# Patient Record
Sex: Male | Born: 1990 | Race: Black or African American | Hispanic: No | Marital: Single | Smoking: Current every day smoker
Health system: Southern US, Community
[De-identification: ages and names within clinical notes are randomized; demographics above are authoritative.]

## PROBLEM LIST (undated history)

## (undated) DIAGNOSIS — E079 Disorder of thyroid, unspecified: Secondary | ICD-10-CM

---
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
## 2011-04-13 ENCOUNTER — Other Ambulatory Visit: Payer: Self-pay | Admitting: Family Medicine

## 2011-04-13 ENCOUNTER — Other Ambulatory Visit: Payer: Self-pay

## 2011-04-13 ENCOUNTER — Other Ambulatory Visit: Payer: Self-pay | Admitting: *Deleted

## 2011-04-13 DIAGNOSIS — E079 Disorder of thyroid, unspecified: Secondary | ICD-10-CM

## 2011-04-13 DIAGNOSIS — E041 Nontoxic single thyroid nodule: Secondary | ICD-10-CM

## 2011-04-18 ENCOUNTER — Ambulatory Visit
Admission: RE | Admit: 2011-04-18 | Discharge: 2011-04-18 | Disposition: A | Discharge status: Home / Self Care | Payer: PRIVATE HEALTH INSURANCE | Source: Ambulatory Visit | Attending: Family Medicine | Admitting: Family Medicine

## 2011-04-18 ENCOUNTER — Other Ambulatory Visit (HOSPITAL_COMMUNITY)
Admission: RE | Admit: 2011-04-18 | Discharge: 2011-04-18 | Disposition: A | Discharge status: Home / Self Care | Payer: Self-pay | Source: Ambulatory Visit | Attending: Interventional Radiology | Admitting: Interventional Radiology

## 2011-04-18 ENCOUNTER — Other Ambulatory Visit: Payer: Self-pay | Admitting: Interventional Radiology

## 2011-04-18 DIAGNOSIS — E049 Nontoxic goiter, unspecified: Secondary | ICD-10-CM | POA: Insufficient documentation

## 2011-04-18 DIAGNOSIS — E079 Disorder of thyroid, unspecified: Secondary | ICD-10-CM

## 2012-12-29 IMAGING — US US THYROID BIOPSY
1 series · 6 of 6 positions shown · non-contrast
Comparison: none

CLINICAL DATA: Complex left thyroid lesion.

ULTRASOUND-GUIDED THYROID ASPIRATION BIOPSY
TECHNIQUE: Survey ultrasound was performed and the dominant lesion
in the left lobe was localized.  An appropriate skin entry site was
determined.  Skin was marked, then prepped with Betadine, draped in
usual sterile fashion, and infiltrated locally with 1% lidocaine.
Under real-time ultrasound guidance, 4  passes were made into the
lesion with 25 and 18 gauge needles sampling cystic and solid
components.  The patient tolerated procedure well, with no
immediate complications.
IMPRESSION
1.  Technically successful ultrasound-guided thyroid aspiration
biopsy

[Series 1: us thyroid biopsy · 0.08mm/px · 6 acquisitions, 6 frames shown]
[im 1/6]
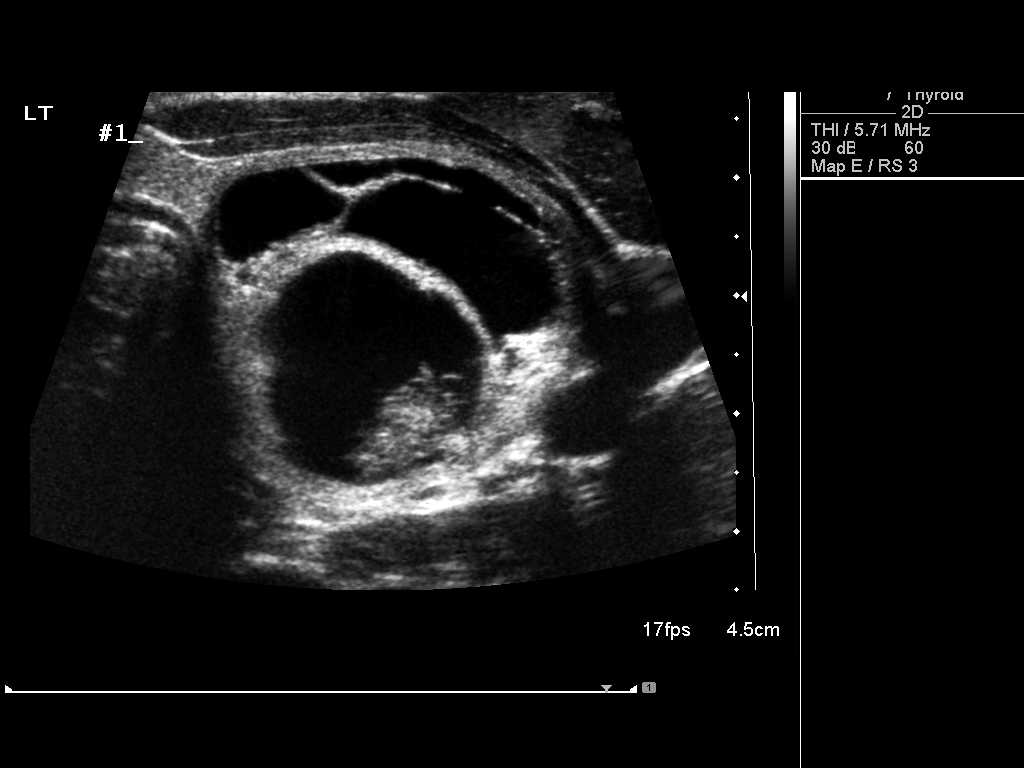
[im 2/6]
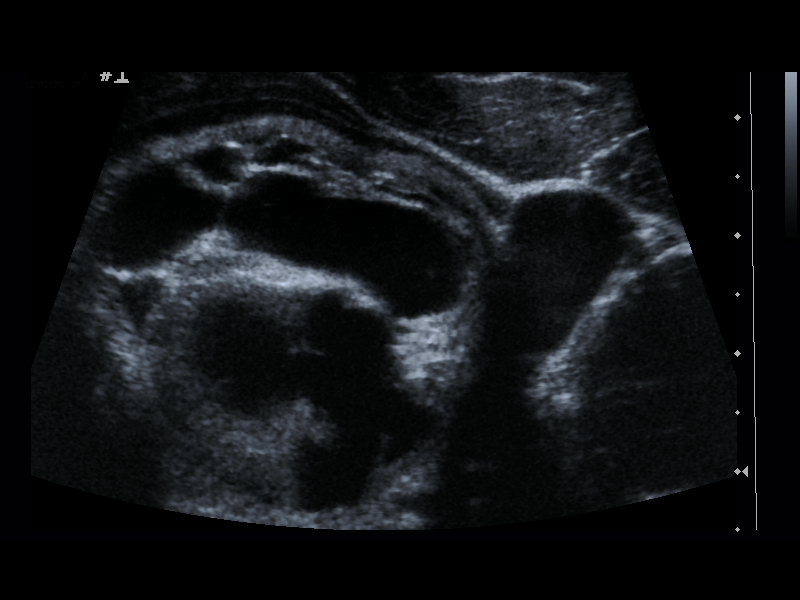
[im 3/6]
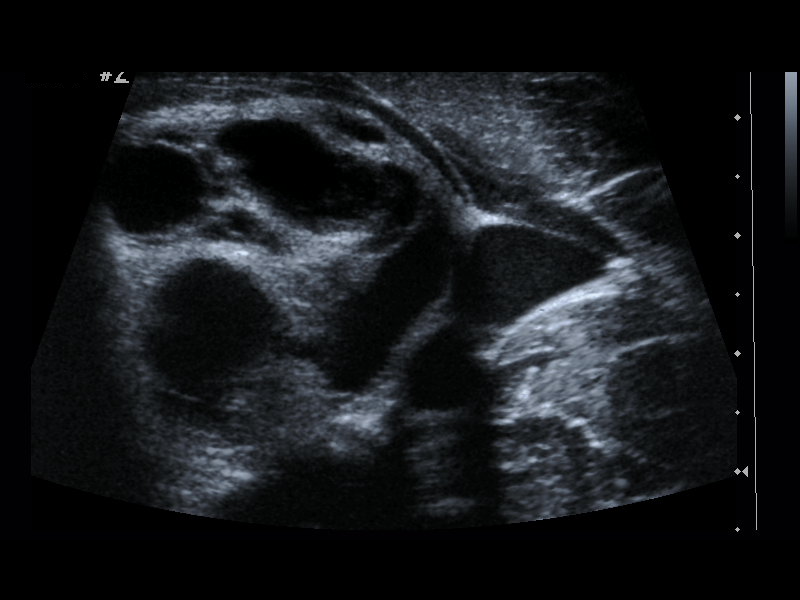
[im 4/6]
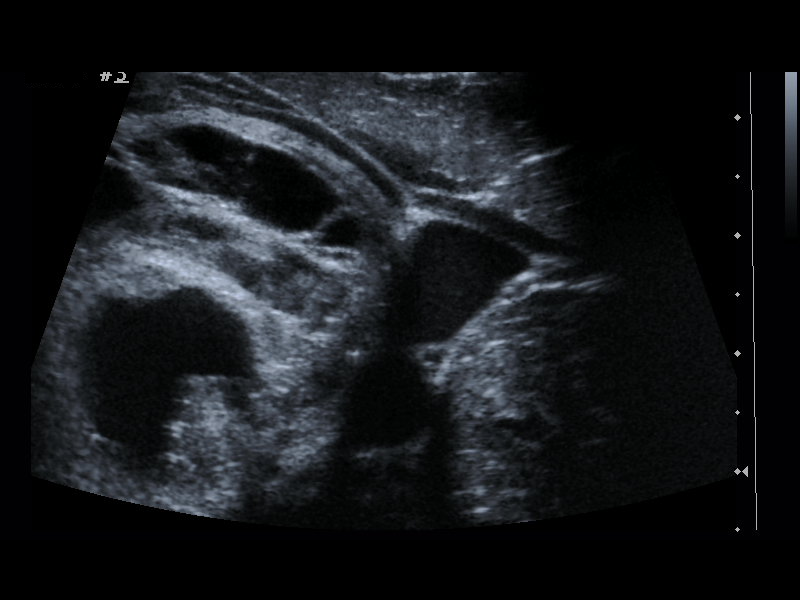
[im 5/6]
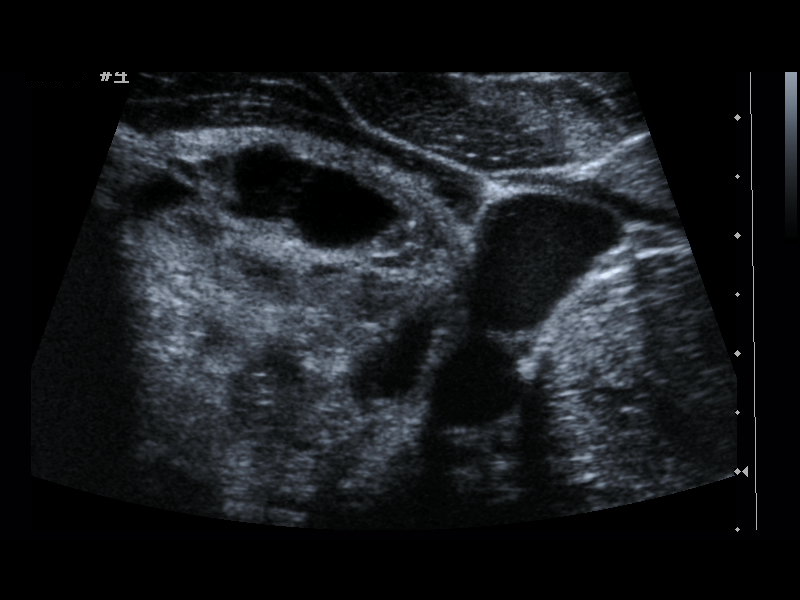
[im 6/6]
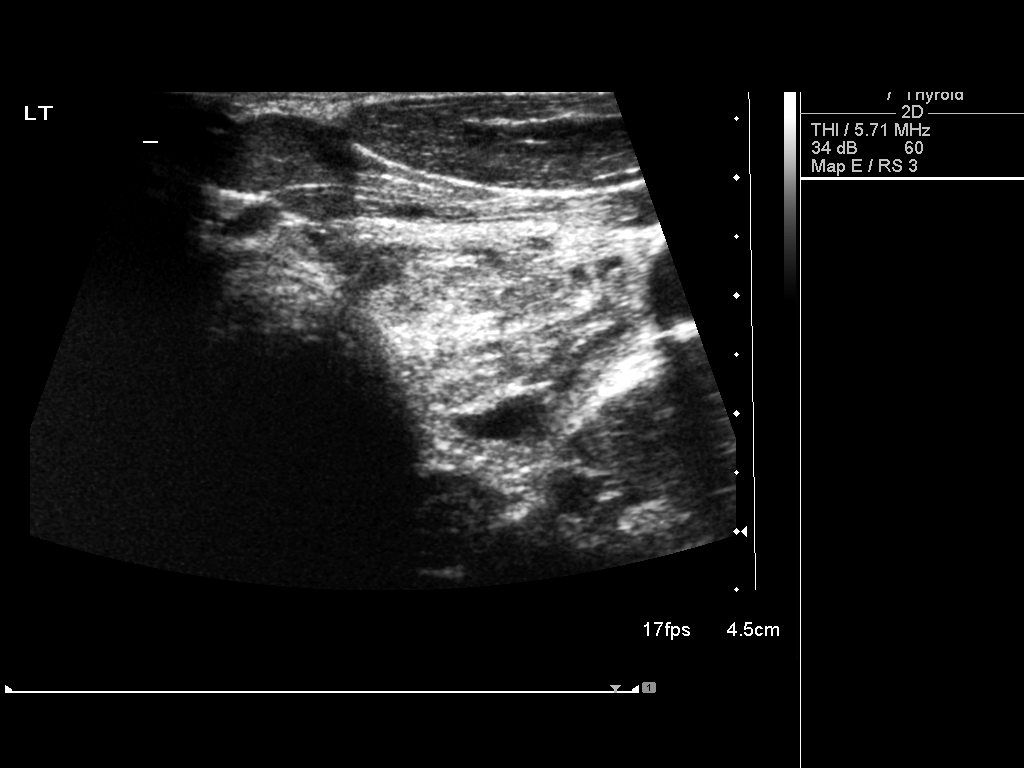

[6 of 6 positions shown; findings below may reference images not displayed]

## 2014-07-16 ENCOUNTER — Emergency Department (HOSPITAL_COMMUNITY)
Admission: EM | Admit: 2014-07-16 | Discharge: 2014-07-16 | Disposition: A | Discharge status: Home / Self Care | Payer: PRIVATE HEALTH INSURANCE

## 2014-07-16 NOTE — ED Notes (Signed)
Pt not found in lobby or outside of ER, first call for triage.

## 2014-07-16 NOTE — ED Notes (Signed)
2nd call for triage, no response from patient.

## 2016-02-13 ENCOUNTER — Encounter (HOSPITAL_COMMUNITY): Payer: Self-pay | Admitting: Nurse Practitioner

## 2016-02-13 ENCOUNTER — Ambulatory Visit (HOSPITAL_COMMUNITY)
Admission: EM | Admit: 2016-02-13 | Discharge: 2016-02-13 | Disposition: A | Discharge status: Home / Self Care | Payer: BLUE CROSS/BLUE SHIELD | Attending: Family Medicine | Admitting: Family Medicine

## 2016-02-13 DIAGNOSIS — E079 Disorder of thyroid, unspecified: Secondary | ICD-10-CM | POA: Diagnosis not present

## 2016-02-13 DIAGNOSIS — F17200 Nicotine dependence, unspecified, uncomplicated: Secondary | ICD-10-CM | POA: Diagnosis not present

## 2016-02-13 DIAGNOSIS — R319 Hematuria, unspecified: Secondary | ICD-10-CM | POA: Insufficient documentation

## 2016-02-13 HISTORY — DX: Disorder of thyroid, unspecified: E07.9

## 2016-02-13 LAB — POCT URINALYSIS DIP (DEVICE)
Glucose, UA: NEGATIVE mg/dL
Ketones, ur: NEGATIVE mg/dL
Nitrite: NEGATIVE
Protein, ur: 30 mg/dL — AB
Specific Gravity, Urine: >=1.03 (ref 1.005–1.030)
Urobilinogen, UA: 0.2 mg/dL (ref 0.0–1.0)
pH: 6 (ref 5.0–8.0)

## 2016-02-13 MED ORDER — CEPHALEXIN 500 MG PO CAPS: 500.0000 mg | ORAL_CAPSULE | Freq: Three times a day (TID) | ORAL | Status: DC

## 2016-02-13 NOTE — Discharge Instructions (Signed)

## 2016-02-13 NOTE — ED Provider Notes (Signed)
CSN: [id number]     Arrival date & time 02/13/16  1430 History   First MD Initiated Contact with Patient 02/13/16 1531     Chief Complaint  Patient presents with  . Hematuria   (Consider location/radiation/quality/duration/timing/severity/associated sxs/prior Treatment) HPI Pt states that at the end of urination this morning he noted a couple of drops of blood. He denies any pain. Last intercourse about 2 weeks ago. Has had a bit of burning, but not today. Also some mild abdo pain in the last couple of days. Past Medical History  Diagnosis Date  . Thyroid disease    History reviewed. No pertinent past surgical history. History reviewed. No pertinent family history. Social History  Substance Use Topics  . Smoking status: Current Every Day Smoker    Types: Cigarettes  . Smokeless tobacco: None  . Alcohol Use: Yes    Review of Systems bood in urine Allergies  Review of patient's allergies indicates no known allergies.  Home Medications   Prior to Admission medications   Medication Sig Start Date End Date Taking? Authorizing Provider  cephALEXin (KEFLEX) 500 MG capsule Take 1 capsule (500 mg total) by mouth 3 (three) times daily. 02/13/16   Tharon AquasFrank C Maigen Mozingo, PA   Meds Ordered and Administered this Visit  Medications - No data to display  BP 132/73 mmHg  Pulse 87  Temp(Src) 99.2 F (37.3 C) (Oral)  Resp 18  SpO2 97% No data found.   Physical Exam NURSES NOTES AND VITAL SIGNS REVIEWED. CONSTITUTIONAL: Well developed, well nourished, no acute distress HEENT: normocephalic, atraumatic EYES: Conjunctiva normal NECK:normal ROM, supple, no adenopathy PULMONARY:No respiratory distress, normal effort MUSCULOSKELETAL: Normal ROM of all extremities,  SKIN: warm and dry without rash PSYCHIATRIC: Mood and affect, behavior are normal  ED Course  Procedures (including critical care time)  Labs Review Labs Reviewed  POCT URINALYSIS DIP (DEVICE) - Abnormal; Notable for the  following:    Bilirubin Urine SMALL (*)    Hgb urine dipstick MODERATE (*)    Protein, ur 30 (*)    Leukocytes, UA SMALL (*)    All other components within normal limits  URINE CULTURE  URINE CYTOLOGY ANCILLARY ONLY    Imaging Review No results found.   Visual Acuity Review  Right Eye Distance:   Left Eye Distance:   Bilateral Distance:    Right Eye Near:   Left Eye Near:    Bilateral Near:      Await urine and std cultures to return rx for keflex is provided.   MDM   1. Hematuria of undiagnosed cause     Patient is reassured that there are no issues that require transfer to higher level of care at this time or additional tests. Patient is advised to continue home symptomatic treatment. Patient is advised that if there are new or worsening symptoms to attend the emergency department, contact primary care provider, or return to UC. Instructions of care provided discharged home in stable condition.    THIS NOTE WAS GENERATED USING A VOICE RECOGNITION SOFTWARE PROGRAM. ALL REASONABLE EFFORTS  WERE MADE TO PROOFREAD THIS DOCUMENT FOR ACCURACY.  I have verbally reviewed the discharge instructions with the patient. A printed AVS was given to the patient.  All questions were answered prior to discharge.      Tharon AquasFrank C Yazir Koerber, PA 02/13/16 [phone]

## 2016-02-13 NOTE — ED Notes (Signed)
Pt c/o one episode of hematuria when waking this morning. He reports pain during the episode but no pain now. He did have an episode of mild abd pain last night that resolved. He denies fevers, n/v. He is sexually active sometimes uses protection.

## 2016-02-14 LAB — URINE CYTOLOGY ANCILLARY ONLY
CHLAMYDIA, DNA PROBE: NEGATIVE
Neisseria Gonorrhea: NEGATIVE

## 2016-02-15 LAB — URINE CULTURE: SPECIAL REQUESTS: NORMAL

## 2016-02-15 NOTE — ED Notes (Signed)
Pt  Asked   Where  His  rx  Was  Sent  To  Chart  Reviewed   Pt  wa  Given  Hard  Copy    Pt  Was  Advised  To take  It to a  Pharmacy   To  Get it filled

## 2016-02-19 ENCOUNTER — Telehealth (HOSPITAL_COMMUNITY): Payer: Self-pay | Admitting: Emergency Medicine

## 2016-02-19 NOTE — ED Notes (Signed)
Pt called back... Results given after being IDd properly. Adv pt to return if sx return.... Pt verb understanding.

## 2016-02-19 NOTE — ED Notes (Signed)
LM on pt's VM [phone] Need to give lab results from recent visit on 4/16 Also let pt know labs can be obtained from MyChart  Per Dr. Dayton ScrapeMurray,  Please let patient know that urine culture does not demonstrate UTI; tests for gonorrhea/chlamydia were negative. [facility] ok to finish this.  Recheck for further evaluation if hematuria/urinary discomfort persist. LM

## 2020-01-11 ENCOUNTER — Other Ambulatory Visit: Payer: Self-pay

## 2020-01-11 ENCOUNTER — Emergency Department (HOSPITAL_COMMUNITY)
Admission: EM | Admit: 2020-01-11 | Discharge: 2020-01-11 | Disposition: A | Discharge status: Home / Self Care | Payer: Self-pay | Attending: Emergency Medicine | Admitting: Emergency Medicine

## 2020-01-11 ENCOUNTER — Emergency Department (HOSPITAL_COMMUNITY): Payer: Self-pay

## 2020-01-11 DIAGNOSIS — S62339A Displaced fracture of neck of unspecified metacarpal bone, initial encounter for closed fracture: Secondary | ICD-10-CM

## 2020-01-11 DIAGNOSIS — Y92009 Unspecified place in unspecified non-institutional (private) residence as the place of occurrence of the external cause: Secondary | ICD-10-CM | POA: Insufficient documentation

## 2020-01-11 DIAGNOSIS — W228XXA Striking against or struck by other objects, initial encounter: Secondary | ICD-10-CM | POA: Insufficient documentation

## 2020-01-11 DIAGNOSIS — Y939 Activity, unspecified: Secondary | ICD-10-CM | POA: Insufficient documentation

## 2020-01-11 DIAGNOSIS — S62336A Displaced fracture of neck of fifth metacarpal bone, right hand, initial encounter for closed fracture: Secondary | ICD-10-CM | POA: Insufficient documentation

## 2020-01-11 DIAGNOSIS — Y999 Unspecified external cause status: Secondary | ICD-10-CM | POA: Insufficient documentation

## 2020-01-11 DIAGNOSIS — F17210 Nicotine dependence, cigarettes, uncomplicated: Secondary | ICD-10-CM | POA: Insufficient documentation

## 2020-01-11 DIAGNOSIS — Z23 Encounter for immunization: Secondary | ICD-10-CM | POA: Insufficient documentation

## 2020-01-11 MED ORDER — OXYCODONE-ACETAMINOPHEN 5-325 MG PO TABS: 2.0000 | ORAL_TABLET | ORAL | 0 refills | Status: DC | PRN

## 2020-01-11 MED ORDER — OXYCODONE-ACETAMINOPHEN 5-325 MG PO TABS
1.0000 | ORAL_TABLET | Freq: Once | ORAL | Status: AC
  Administered 2020-01-11: 17:00:00 1 via ORAL
  Filled 2020-01-11: qty 1

## 2020-01-11 MED ORDER — TETANUS-DIPHTH-ACELL PERTUSSIS 5-2.5-18.5 LF-MCG/0.5 IM SUSP
0.5000 mL | Freq: Once | INTRAMUSCULAR | Status: AC
Start: 2020-01-11 — End: 2020-01-11
  Administered 2020-01-11: 0.5 mL via INTRAMUSCULAR
  Filled 2020-01-11: qty 0.5

## 2020-01-11 NOTE — Discharge Instructions (Addendum)
Please follow up with hand surgery for re-evaluation. Call tomorrow morning to schedule an appointment.   Please use Tylenol or ibuprofen for pain.  You may use 600 mg ibuprofen every 6 hours or 1000 mg of Tylenol every 6 hours.  You may choose to alternate between the 2.  This would be most effective.  Not to exceed 4 g of Tylenol within 24 hours.  Not to exceed 3200 mg ibuprofen 24 hours.  Take percocet as needed for pain if ibu/ty aren't able to stop pain.   Wear splint until follow up with hand surgery.   Rest, ice and elevate your hand

## 2020-01-11 NOTE — ED Triage Notes (Signed)
Pt to ED with c/o of right hand pain/swelling from punching the wall. Pt hand is swollen and hot to the touch. Pt states pain is 3/10 and took ibuprofen this morning with relief.

## 2020-01-11 NOTE — ED Notes (Signed)
Called [facility] ortho tech and she will be here in 10 minutes.
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
## 2020-01-12 ENCOUNTER — Encounter: Payer: Self-pay | Admitting: Plastic Surgery

## 2020-01-12 ENCOUNTER — Ambulatory Visit (INDEPENDENT_AMBULATORY_CARE_PROVIDER_SITE_OTHER): Payer: Self-pay | Admitting: Plastic Surgery

## 2020-01-12 VITALS — BP 146/92 | HR 86 | Temp 98.8°F | Ht 72.0 in | Wt 251.0 lb

## 2020-01-12 DIAGNOSIS — S62336A Displaced fracture of neck of fifth metacarpal bone, right hand, initial encounter for closed fracture: Secondary | ICD-10-CM
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
## 2020-01-21 ENCOUNTER — Other Ambulatory Visit: Payer: Self-pay

## 2020-01-21 ENCOUNTER — Encounter: Payer: Self-pay | Admitting: *Deleted

## 2020-01-21 ENCOUNTER — Ambulatory Visit: Payer: Self-pay | Attending: Plastic Surgery | Admitting: *Deleted

## 2020-01-21 DIAGNOSIS — M6281 Muscle weakness (generalized): Secondary | ICD-10-CM | POA: Insufficient documentation

## 2020-01-21 DIAGNOSIS — M25641 Stiffness of right hand, not elsewhere classified: Secondary | ICD-10-CM | POA: Insufficient documentation

## 2020-01-21 DIAGNOSIS — R601 Generalized edema: Secondary | ICD-10-CM | POA: Insufficient documentation

## 2020-01-21 DIAGNOSIS — R278 Other lack of coordination: Secondary | ICD-10-CM | POA: Insufficient documentation

## 2020-01-21 NOTE — Therapy (Signed)
[facility] [address] [place], [place], [address] Phone: [phone]   Fax:  [phone]  Occupational Therapy Evaluation  Patient Details  Name: Eric King M Bonham MRN: [id number] Date of Birth: 02/26/1991 Referring Provider (OT): Wendy Poetollier S. Arita MissPace, MD   Encounter Date: 01/21/2020  OT End of Session - 01/21/20 [id number]    Visit Number  1    Number of Visits  12    Date for OT Re-Evaluation  02/18/20    Authorization Type  Pt is currently self pay. He may apply for Medicaid per his report.    OT Start Time  0800    OT Stop Time  0915    OT Time Calculation (min)  75 min    Activity Tolerance  Patient tolerated treatment well    Behavior During Therapy  WFL for tasks assessed/performed       Past Medical History:  Diagnosis Date   Thyroid disease     History reviewed. No pertinent surgical history.  There were no vitals filed for this visit.  Subjective Assessment - 01/21/20 0806    Subjective   Pt is a R HD male s/p R Boxer's Fracture on 01/11/2020 after he punched a wall. He was splinted in the ER on 01/11/20, followed up with Dr Arita MissPace on 01/12/20 at which time it was determined that he would not undergo surgery. He presents to out-pt OT today for buddy taping and transitioning from splint. Pt presents to clinic w/o splint R hand.    Pertinent History  R hand Boxers Fracture 01/11/2020, non-surgical intervention. R HD. Thyroid enlarged - does not take medication, MD is monitoring. Otherwise non-significant PMH , please see chart for full medical history.    Limitations  Pt verbaliuzed that he was supposed to be wearing the splint as fabricated in the ER "For the next 2 weeks" until he f/u with Dr Arita MissPace. He presented to the clinic today w/o any protective splint on and stated that he had removed it himself at home. He was educated to not use R hand functionally until cleared by the Dr to do so. He was then fitted with a  protective hand based splint placing his MCP's in flexion and his IP's in extension to his right RF/small as well as issued buddy straps per MD orders.    Patient Stated Goals  Pt reports "Get the stiffness out and move it w/o a problem" Get back to normal.    Currently in Pain?  No/denies    Pain Score  0-No pain    Multiple Pain Sites  No        [facility] OT Assessment - 01/21/20 0001      Assessment   Medical Diagnosis  R Hand 5th Metacarpal Fracture     Referring Provider (OT)  Wendy Poetollier S. Arita MissPace, MD    Onset Date/Surgical Date  01/11/20   Date of Injury   Hand Dominance  Right    Next MD Visit  02/13/2020      Precautions   Precautions  None    Required Braces or Orthoses  Other Brace/Splint   Pt presents w/o splint as issued by ER. Needs buddy taping.     Restrictions   Weight Bearing Restrictions  No   Pt reports no WB restrictions, however he was advised agains     Balance Screen   Has the patient fallen in the past 6 months  No    Has the patient had  a decrease in activity level because of a fear of falling?   No    Is the patient reluctant to leave their home because of a fear of falling?   No      Home  Environment   Family/patient expects to be discharged to:  Private residence    Lives With  --   Family and friends per pt report     Prior Function   Level of Independence  Independent    Vocation  --   Works at Engineer, site ducts and vents, out until next week   Leisure  --   Printmaker with friends, go to [employer], video games.     ADL   ADL comments  Pt reports some difficulty with basic ADL's requiring R dominant hand for opening, bathing, dressing but is otherwise Mod I given incresaed time.      Written Expression   Dominant Hand  Right      Activity Tolerance   Activity Tolerance  Endurance does not limit participation in activity      Cognition   Overall Cognitive Status  Within Functional Limits for tasks assessed      Observation/Other Assessments     Observations  Edema noted upon visual observation in clinic today esp along Right dorsal MCP area/5th metacarpal and digits.    Skin Integrity  Dorsal healing wounds right hand between middle and ring fingers and distal to MCP of small finger noted. No drainage noted.      Sensation   Light Touch  Appears Intact      Coordination   Gross Motor Movements are Fluid and Coordinated  Yes    Fine Motor Movements are Fluid and Coordinated  No    Coordination and Movement Description  Stiffness noted right ring and small fingers      Edema   Edema  See notes above. Min-mod edema noted upon visual observation comparing Right to left non-injured hand.      ROM / Strength   AROM / PROM / Strength  AROM      AROM   Overall AROM   Deficits      Right Hand AROM   R Ring  MCP 0-90  --   0-60   R Ring PIP 0-100  --   0-96*   R Ring DIP 0-70  --   0-60*   R Little  MCP 0-90  --   -20-45*   R Little PIP 0-100  --   0-100*   R Little DIP 0-70  --   0-75*     Hand Function   Right Hand Gross Grasp  --   NT secondary to fracture MCP 5th   Right Hand Grip (lbs)  --   NT secondary to 5th Metacarpal Fx.      OT Treatments/Exercises (OP) - 01/21/20 0001      Splinting   Splinting  Pt was issued buddy straps as prescribed by MD in orders for right RF/Small following boxer's fracture to 5th metacarpal. Pt was instructed in use, care and precautions. Following this education, pt stated that he was supposed to wear his protective splint as formed in the ER on 01/11/2020, but he had removed it himself and presented to the clinic today without anything on. He was then fitted with a protective hand based splint that places his right ring and small fingers in MCP flexion and IP extension. Pt was educated in splint use, care and precautions.  He verbalized understanding of all of the above. He was advised to not use this right dominant hand for functional activity until given the "ok" by Dr Arita Miss based  on fracture healing. He again verbalized undertanding of this in the clinic today. He was issued writtend instructions for all of the above and this was also reviewed in the clinic.         How to Buddy Tape Buddy taping refers to taping an injured finger or toe to an uninjured finger or toe that is next to it. This protects the injured finger or toe and keeps it from moving while the injury heals. You may buddy tape a finger or toe if you have a minor sprain. Your health care provider may buddy tape your finger or toe if you have a sprain, dislocation, or fracture. You may be told to replace your buddy taping as needed. What are the risks? Generally, buddy taping is safe. However, problems may occur, such as:  Skin injury or infection.  Reduced blood flow to the finger or toe.  Skin reaction to the tape. Do not buddy tape your toe if you have diabetes. Do not buddy tape if you know that you have an allergy to adhesives or surgical tape. Supplies needed:  Gauze pad, cotton, or cloth.  Tape. This may be called first-aid tape, surgical tape, or medical tape. How to buddy tape Before buddy taping Lessen any pain and swelling with rest, icing, and elevation:  Avoid activities that cause pain.  If directed, put ice on the injured area: ? Put ice in a plastic bag. ? Place a towel between your skin and the bag. ? Leave the ice on for 20 minutes, 2-3 times a day.  Raise (elevate) your hand or foot above the level of your heart while you are sitting or lying down.  Buddy taping procedure   Clean and dry your finger or toe as told by your health care provider.  Place a gauze pad or a piece of cloth or cotton between your injured finger or toe and the uninjured finger or toe.  Use tape to wrap around both fingers or toes so your injured finger or toe is secured to the uninjured finger or toe. ? The tape should be snug, but not tight. ? Make sure the ends of the piece of tape  overlap. ? Avoid placing tape directly over the joint.  Change the tape and the padding as told by your health care provider. Remove and replace the tape or padding if it becomes loose, worn, dirty, or wet. After buddy taping  Watch the buddy-taped area and always remove buddy taping if your: ? Pain gets worse. ? Fingers or toes turn pale or blue. ? Skin becomes irritated. Follow these instructions at home:  Take over-the-counter and prescription medicines only as told by your health care provider.  Return to your normal activities as told by your health care provider. Ask your health care provider what activities are safe for you. Contact a health care provider if:  You have pain, swelling, or bruising that lasts longer than 3 days.  You have a fever.  Your skin is red, cracked, or irritated. Get help right away if:  The injured area becomes cold, numb, or pale.  You have severe pain, swelling, bruising, or loss of movement in your finger or toe.  Your finger or toe changes shape (deformity). Summary  Buddy taping refers to taping an injured finger or toe  to an uninjured finger or toe that is next to it.  You may buddy tape a finger or toe if you have a minor sprain.  Take over-the-counter and prescription medicines only as told by your health care provider. This information is not intended to replace advice given to you by your health care provider. Make sure you discuss any questions you have with your health care provider. Document Revised: 02/06/2019 Document Reviewed: 10/28/2018 Elsevier Patient Education  2020 [employer].  Wear your protective splint that was made in for you in the emergency room for next two weeks for protection as directed by Dr Arita Miss. Buddy tape when not wearing the brace as discussed in the clinic today. Keep hand/fingers dry and monitor.   Elevate your hand and massage for swelling as discussed in clinic today.      OT Education - 01/21/20  4656    Education Details  Fracture, wound healing. Buddy strap and protective splinting use, care and precautions. Pt was educated to not use Right dominant hand for functional use at this time and wear splint(s) as directed.    Person(s) Educated  Patient    Methods  Explanation;Demonstration;Verbal cues;Handout    Comprehension  Verbalized understanding       OT Short Term Goals - 01/21/20 1148      OT SHORT TERM GOAL #1   Title  Pt will be Mod I splint use, care and precautions R    Baseline  Min verbal cues required    Time  3    Period  Weeks    Status  New    Target Date  02/11/20      OT SHORT TERM GOAL #2   Title  Pt will be Mod I buddy strap use R RF/sm on dominant hand    Baseline  Min Assist req for don/doffing    Time  3    Period  Weeks    Status  New    Target Date  02/11/20      OT SHORT TERM GOAL #3   Title  Pt will be Mod I edema management techniques R hand and wrist    Baseline  Min verbal cues required    Time  3    Period  Weeks    Status  New    Target Date  02/11/20        OT Long Term Goals - 01/21/20 1150      OT LONG TERM GOAL #1   Title  Pt will demonstrate active ROM right ring and small fingers for full and flat fist in preparation for daily home and work related activities.    Baseline  Unable - see A/ROM dated 01/21/2020    Time  6    Period  Weeks    Status  New    Target Date  03/03/20      OT LONG TERM GOAL #2   Title  Pt will demonstrate grip strength of 15# or greater R dominant hand in preparation for return to work activities    Baseline  Unable    Time  6    Period  Weeks    Status  New    Target Date  03/03/20      OT LONG TERM GOAL #3   Title  Pt will be Mod I upgraded HEP R dominant hand    Baseline  dependent    Time  6    Period  Weeks  Status  New    Target Date  03/03/20            Plan - 01/21/20 8099    Clinical Impression Statement  Pt is a pleasant 29 y/o right hand dominant male whom presents  per Dr Arita Miss today for buddy strapping following a 5th metacarpal fracture/boxer's fracture ( ICD 10 # S62.3) on 01/11/2020 that was not treated surgically, he is currently 10 days s/p initial injury. The patient presented to clinic for buddy strapping and pt education. He was w/o any protective splint on his right hand and stated that he was supposed to continue wearing the splint as fabricated in the ER on 01/11/2020 but "I took it off, I couldn't stand it anymore." He presents with edema dorsal right hand/digits, decreased A/ROM, functional use and need for protection and pt education. He denies any pain at this time. He was instructed that he should cont with protective splinting R hand and was therefore fitted with a custom hand based ulnar gutter splint that places his hand in the postected position of rign and small finger flaxion (~70*) with IP's extended. He was also fitted with buddy straps and instructed in use, care and precautions for both the splint and buddy straps. He was educated to cont with this until his f/u w/ Dr Arita Miss and is told tat his fracture is healed and d/c'd. He verbalized understanding of this. He shold benefit from splint check/adjustments PRN, home program, edema management and HEP as per fracture healing and guidance from Dr Arita Miss.    OT Occupational Profile and History  Problem Focused Assessment - Including review of records relating to presenting problem    Occupational performance deficits (Please refer to evaluation for details):  ADL's;IADL's;Work    Body Structure / Function / Physical Skills  ADL;Dexterity;ROM;Edema;Mobility;Flexibility;Strength;FMC;Coordination;Decreased knowledge of precautions;UE functional use;Pain    Rehab Potential  Good    Clinical Decision Making  Limited treatment options, no task modification necessary    Comorbidities Affecting Occupational Performance:  May have comorbidities impacting occupational performance    Modification or Assistance to  Complete Evaluation   No modification of tasks or assist necessary to complete eval    OT Frequency  Other (comment)   1-2x/week (Pt is currently self pay, may need to adjust)   OT Duration  6 weeks    OT Treatment/Interventions  Self-care/ADL training;Fluidtherapy;Splinting;Therapeutic activities;Therapeutic exercise;Passive range of motion;Paraffin;Patient/family education;Cryotherapy;Contrast Bath;Scar mobilization    Plan  R hand based Splint check and adjustment, review buddy strap use R RF/Sm. Check splint as fabricated by ER dept & make necessary recommendations (Pt is supposed to bring to next appointment).    Consulted and Agree with Plan of Care  Patient       Patient will benefit from skilled therapeutic intervention in order to improve the following deficits and impairments:   Body Structure / Function / Physical Skills: ADL, Dexterity, ROM, Edema, Mobility, Flexibility, Strength, FMC, Coordination, Decreased knowledge of precautions, UE functional use, Pain       Visit Diagnosis: Stiffness of right hand, not elsewhere classified - Plan: Ot plan of care cert/re-cert  Muscle weakness (generalized) - Plan: Ot plan of care cert/re-cert  Other lack of coordination - Plan: Ot plan of care cert/re-cert  Generalized edema - Plan: Ot plan of care cert/re-cert    Problem List There are no problems to display for this patient.   Charletta Cousin, Maurya Nethery Beth Dixon, OTR/L 01/21/2020, 11:59 AM  [facility]  [address] [place], [place], [address] Phone: [phone]   Fax:  [phone]  Name: CLIFFARD HAIR MRN: [id number] Date of Birth: 1991-06-02

## 2020-01-21 NOTE — Patient Instructions (Signed)
How to Buddy Tape Buddy taping refers to taping an injured finger or toe to an uninjured finger or toe that is next to it. This protects the injured finger or toe and keeps it from moving while the injury heals. You may buddy tape a finger or toe if you have a minor sprain. Your health care provider may buddy tape your finger or toe if you have a sprain, dislocation, or fracture. You may be told to replace your buddy taping as needed. What are the risks? Generally, buddy taping is safe. However, problems may occur, such as:  Skin injury or infection.  Reduced blood flow to the finger or toe.  Skin reaction to the tape. Do not buddy tape your toe if you have diabetes. Do not buddy tape if you know that you have an allergy to adhesives or surgical tape. Supplies needed:  Gauze pad, cotton, or cloth.  Tape. This may be called first-aid tape, surgical tape, or medical tape. How to buddy tape Before buddy taping Lessen any pain and swelling with rest, icing, and elevation:  Avoid activities that cause pain.  If directed, put ice on the injured area: ? Put ice in a plastic bag. ? Place a towel between your skin and the bag. ? Leave the ice on for 20 minutes, 2-3 times a day.  Raise (elevate) your hand or foot above the level of your heart while you are sitting or lying down.  Buddy taping procedure   Clean and dry your finger or toe as told by your health care provider.  Place a gauze pad or a piece of cloth or cotton between your injured finger or toe and the uninjured finger or toe.  Use tape to wrap around both fingers or toes so your injured finger or toe is secured to the uninjured finger or toe. ? The tape should be snug, but not tight. ? Make sure the ends of the piece of tape overlap. ? Avoid placing tape directly over the joint.  Change the tape and the padding as told by your health care provider. Remove and replace the tape or padding if it becomes loose, worn, dirty,  or wet. After buddy taping  Watch the buddy-taped area and always remove buddy taping if your: ? Pain gets worse. ? Fingers or toes turn pale or blue. ? Skin becomes irritated. Follow these instructions at home:  Take over-the-counter and prescription medicines only as told by your health care provider.  Return to your normal activities as told by your health care provider. Ask your health care provider what activities are safe for you. Contact a health care provider if:  You have pain, swelling, or bruising that lasts longer than 3 days.  You have a fever.  Your skin is red, cracked, or irritated. Get help right away if:  The injured area becomes cold, numb, or pale.  You have severe pain, swelling, bruising, or loss of movement in your finger or toe.  Your finger or toe changes shape (deformity). Summary  Buddy taping refers to taping an injured finger or toe to an uninjured finger or toe that is next to it.  You may buddy tape a finger or toe if you have a minor sprain.  Take over-the-counter and prescription medicines only as told by your health care provider. This information is not intended to replace advice given to you by your health care provider. Make sure you discuss any questions you have with your health care provider.   Document Revised: 02/06/2019 Document Reviewed: 10/28/2018 Elsevier Patient Education  2020 [employer].  Wear your protective splint that was made in for you in the emergency room for next two weeks for protection as directed by Dr Arita Miss. Buddy tape when not wearing the brace as discussed in the clinic today. Keep hand/fingers dry and monitor.   Elevate your hand and massage for swelling as discussed in clinic today.

## 2020-02-04 ENCOUNTER — Telehealth: Payer: Self-pay

## 2020-02-04 ENCOUNTER — Ambulatory Visit: Payer: Self-pay | Attending: Plastic Surgery | Admitting: Occupational Therapy

## 2020-02-04 ENCOUNTER — Other Ambulatory Visit: Payer: Self-pay

## 2020-02-04 DIAGNOSIS — M25641 Stiffness of right hand, not elsewhere classified: Secondary | ICD-10-CM | POA: Insufficient documentation

## 2020-02-04 DIAGNOSIS — M6281 Muscle weakness (generalized): Secondary | ICD-10-CM | POA: Insufficient documentation

## 2020-02-04 NOTE — Therapy (Signed)
[facility] [address] [place], [place], [address] Phone: [phone]   Fax:  [phone]  Occupational Therapy Treatment  Patient Details  Name: Eric King MRN: [id number] Date of Birth: 17-Sep-1991 Referring Provider (OT): Wendy Poet. Arita Miss, MD   Encounter Date: 02/04/2020  OT End of Session - 02/04/20 1056    Visit Number  2    Number of Visits  12    Date for OT Re-Evaluation  02/18/20    Authorization Type  Pt is currently self pay. He may apply for Medicaid per his report.    OT Start Time  0805    OT Stop Time  0840    OT Time Calculation (min)  35 min    Activity Tolerance  Patient tolerated treatment well    Behavior During Therapy  WFL for tasks assessed/performed       Past Medical History:  Diagnosis Date  . Thyroid disease     No past surgical history on file.  There were no vitals filed for this visit.  Subjective Assessment - 02/04/20 0812    Subjective   When asked where his buddy straps were, pt reports he left them at home this morning. New straps provided today and instructed he needs to wear them during the day and w/ HEP (Pt wearing splint at night)    Pertinent History  R hand Boxers Fracture 01/11/2020, non-surgical intervention. R HD. Thyroid enlarged - does not take medication, MD is monitoring. Otherwise non-significant PMH , please see chart for full medical history.    Limitations  Pt verbaliuzed that he was supposed to be wearing the splint as fabricated in the ER "For the next 2 weeks" until he f/u with Dr Arita Miss. He presented to the clinic today w/o any protective splint on and stated that he had removed it himself at home. He was educated to not use R hand functionally until cleared by the Dr to do so. He was then fitted with a protective hand based splint placing his MCP's in flexion and his IP's in extension to his right RF/small as well as issued buddy straps per MD orders.    Patient Stated Goals  "Get the stiffness out and move it w/o a problem" Get back to normal.    Currently in Pain?  No/denies       Pt arrived with no buddy straps reporting he left them at home. Pt provided w/ 2 more today and reviewed proper wear and reviewed current precautions (no P/ROM, no strengthening, no finger abd).  Fluidotherapy x 12 min Rt hand to decrease stiffness at beginning of session.  Pt issued A/ROM HEP for hand/digits with buddy straps on. Pt instructed only to perform HEP w/ straps ON. Pt also instructed to wear buddy strapping during the day (wears splint at night).  Telephone encounter sent to MD as pt sees him on 02/11/20 - see MD's response in EPIC                      OT Short Term Goals - 02/04/20 1056      OT SHORT TERM GOAL #1   Title  Pt will be Mod I splint use, care and precautions R    Baseline  Min verbal cues required    Time  3    Period  Weeks    Status  On-going   pt still needs reinforcement of precautions   Target Date  02/11/20      OT SHORT TERM GOAL #2   Title  Pt will be Mod I buddy strap use R RF/sm on dominant hand    Baseline  Min Assist req for don/doffing    Time  3    Period  Weeks    Status  On-going   Pt was not wearing upon arrival on 02/04/20   Target Date  02/11/20      OT SHORT TERM GOAL #3   Title  Pt will be Mod I edema management techniques R hand and wrist    Baseline  Min verbal cues required    Time  3    Period  Weeks    Status  New    Target Date  02/11/20        OT Long Term Goals - 01/21/20 1150      OT LONG TERM GOAL #1   Title  Pt will demonstrate active ROM right ring and small fingers for full and flat fist in preparation for daily home and work related activities.    Baseline  Unable - see A/ROM dated 01/21/2020    Time  6    Period  Weeks    Status  New    Target Date  03/03/20      OT LONG TERM GOAL #2   Title  Pt will demonstrate grip strength of 15# or greater R dominant hand  in preparation for return to work activities    Baseline  Unable    Time  6    Period  Weeks    Status  New    Target Date  03/03/20      OT LONG TERM GOAL #3   Title  Pt will be Mod I upgraded HEP R dominant hand    Baseline  dependent    Time  6    Period  Weeks    Status  New    Target Date  03/03/20            Plan - 02/04/20 1057    Clinical Impression Statement  Pt arrived w/ no buddy straps and not following precautions. Reviewed precautions today and emphasized need to wear buddy straps during the day and with ex's (wearing splint at night). Pt instructed specifically to avoid P/ROM, any strengthening/resistive activities, and finger abd..    Occupational performance deficits (Please refer to evaluation for details):  ADL's;IADL's;Work    Body Structure / Function / Physical Skills  ADL;Dexterity;ROM;Edema;Mobility;Flexibility;Strength;FMC;Coordination;Decreased knowledge of precautions;UE functional use;Pain    Rehab Potential  Good    OT Frequency  --   1-2x/wk (pt currently self pay)   OT Duration  6 weeks    OT Treatment/Interventions  Self-care/ADL training;Fluidtherapy;Splinting;Therapeutic activities;Therapeutic exercise;Passive range of motion;Paraffin;Patient/family education;Cryotherapy;Contrast Bath;Scar mobilization    Plan  Pt to see MD on the 14th and returns to therapy on the 15th - see telephone encounter in EPIC re: progression of therapy (sent today to Dr. Claudia Desanctis)    Consulted and Agree with Plan of Care  Patient       Patient will benefit from skilled therapeutic intervention in order to improve the following deficits and impairments:   Body Structure / Function / Physical Skills: ADL, Dexterity, ROM, Edema, Mobility, Flexibility, Strength, FMC, Coordination, Decreased knowledge of precautions, UE functional use, Pain       Visit Diagnosis: Stiffness of right hand, not elsewhere classified  Muscle weakness (generalized)    Problem List There  are  no problems to display for this patient.   Kelli Churn, OTR/L 02/04/2020, 11:01 AM  [facility] [address] [place], [place], [address] Phone: [phone]   Fax:  [phone]  Name: Eric King MRN: [id number] Date of Birth: [birth year]

## 2020-02-04 NOTE — Telephone Encounter (Signed)
Ok thankyou.  For now let's plan to progress to passive ROM after my next visit.  He should be ready for that by then.  I'll let you know if anything changes after I see him .  Thanks.

## 2020-02-04 NOTE — Telephone Encounter (Signed)
Hello Dr Arita Miss,   I saw Eric King today. He arrived without his buddy straps, so I issued more today and instructed him to wear during the day and while performing A/ROM HEP. He is wearing his splint at night. Reviewed with patient precautions including no P/ROM or strengthening at this time. He has a f/u appointment with you on the 14th and then we see him again on the 15th. Please let us know if there is anything we can progress to at that time including: possible d/c of buddy straps and beginning P/ROM?  Thank you,  Jene Every OTR/L

## 2020-02-04 NOTE — Patient Instructions (Signed)
   WITH BUDDY STRAPS ON, PERFORM BELOW EXERCISES:    Flexor Tendon Gliding (Active Hook Fist)   With fingers and knuckles straight, bend middle and tip joints. Do not bend large knuckles. Repeat _10__ times. Do _4__ sessions per day.  MP Flexion (Active)   With back of hand on table, bend large knuckles as far as they will go, keeping small joints straight. Repeat _10___ times. Do __4__ sessions per day. Activity: Reach into a narrow container.*      Finger Flexion / Extension   With palm up, bend fingers of left hand toward palm, making a  fist. Straighten fingers, opening fist. Repeat sequence _10__ times per session. Do _4__ sessions per day. Hand Variation: Palm down   **DO NOT USE RT HAND FOR ANY LIFTING, GRIPPING, PULLING, PUSHING AT THIS TIME.  DO NOT SEPARATE FINGERS!

## 2020-02-11 ENCOUNTER — Other Ambulatory Visit: Payer: Self-pay

## 2020-02-11 ENCOUNTER — Encounter: Payer: Self-pay | Admitting: Plastic Surgery

## 2020-02-11 ENCOUNTER — Ambulatory Visit (INDEPENDENT_AMBULATORY_CARE_PROVIDER_SITE_OTHER): Payer: Self-pay | Admitting: Plastic Surgery

## 2020-02-11 VITALS — BP 141/84 | HR 83 | Temp 97.8°F | Ht 72.0 in | Wt 250.8 lb

## 2020-02-11 DIAGNOSIS — S62336A Displaced fracture of neck of fifth metacarpal bone, right hand, initial encounter for closed fracture: Secondary | ICD-10-CM

## 2020-02-11 NOTE — Progress Notes (Signed)
   Referring Provider No referring provider defined for this encounter.   CC:  Chief Complaint  Patient presents with  . Follow-up    closed displaced fracture of neck of fifth metacarpal bone of right hand      Eric King is an 29 y.o. male.  HPI: Patient is here for 1 month follow-up after a angulated boxer's fracture.  We elected to pursue nonoperative management.  He has been going to therapy and progressing with range of motion.  He no longer has any pain and feels like he can make a full range of motion and complete fist.  He has no other complaints.  Review of Systems General: Denies fevers, chills  Physical Exam Vitals with BMI 02/11/2020 01/12/2020 01/11/2020  Height 6\' 0"  6\' 0"  -  Weight 250 lbs 13 oz 251 lbs -  BMI 34.01 34.03 -  Systolic 141 146  Diastolic 84 92 86  Pulse 83 86 73    General:  No acute distress,  Alert and oriented, Non-Toxic, Normal speech and affect Hand: Fingers are well perfused with normal cap refill palp radial pulse.  Sensation is intact throughout.  He has a full range of motion no extensor lag.  He can make a complete fist with all fingers.  There is no rotational malalignment.  There is no pain to palpation.  Assessment/Plan Patient is doing well after nonoperative management of the boxer's fracture.  He has no further restrictions from me.  He can continue with strengthening and range of motion and therapy if he wishes.  Far as following up with me have instructed him to do so as needed.  02/11/2020, 9:21 AM

## 2020-02-12 ENCOUNTER — Ambulatory Visit: Payer: Self-pay | Admitting: Occupational Therapy

## 2020-02-18 ENCOUNTER — Ambulatory Visit: Payer: Self-pay | Admitting: Occupational Therapy

## 2020-02-25 ENCOUNTER — Ambulatory Visit: Payer: Self-pay | Admitting: Occupational Therapy

## 2020-11-08 NOTE — Therapy (Signed)
[facility] [address] [place], [place], [address] Phone: [phone]   Fax:  [phone]  Patient Details  Name: Eric King MRN: [id number] Date of Birth: 08/02/1991 Referring Provider:  No ref. provider found  Encounter Date: 11/08/2020  Pt never returned after the 2nd visit on 02/04/20, therefore will d/c episode of care at this time  Kelli Churn, OTR/L 11/08/2020, 2:47 PM  [facility] [address] [place], [place], [address] Phone: [phone]   Fax:  [phone]

## 2021-09-23 IMAGING — DX DG HAND COMPLETE 3+V*R*
3 series · 3 of 3 positions shown · non-contrast
Comparison: None.

CLINICAL DATA: Pain and swelling of the right hand since the
patient punched a wall yesterday. Initial encounter.

EXAM:
RIGHT HAND - COMPLETE 3+ VIEW

[hand ap]
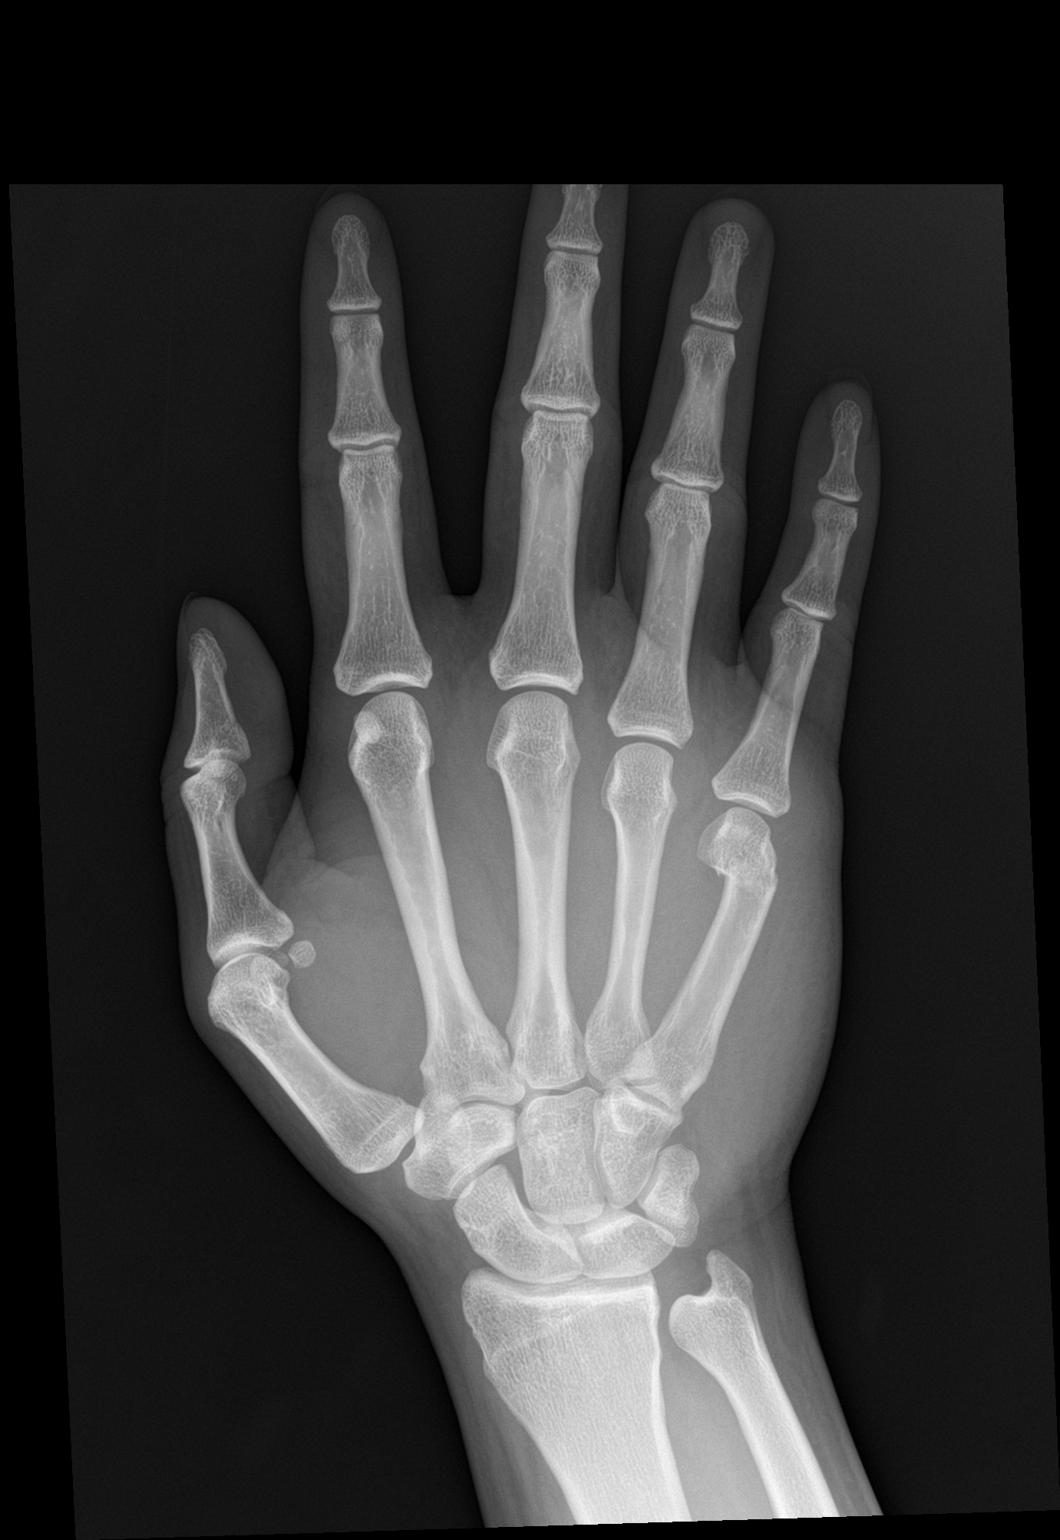

[hand obl]
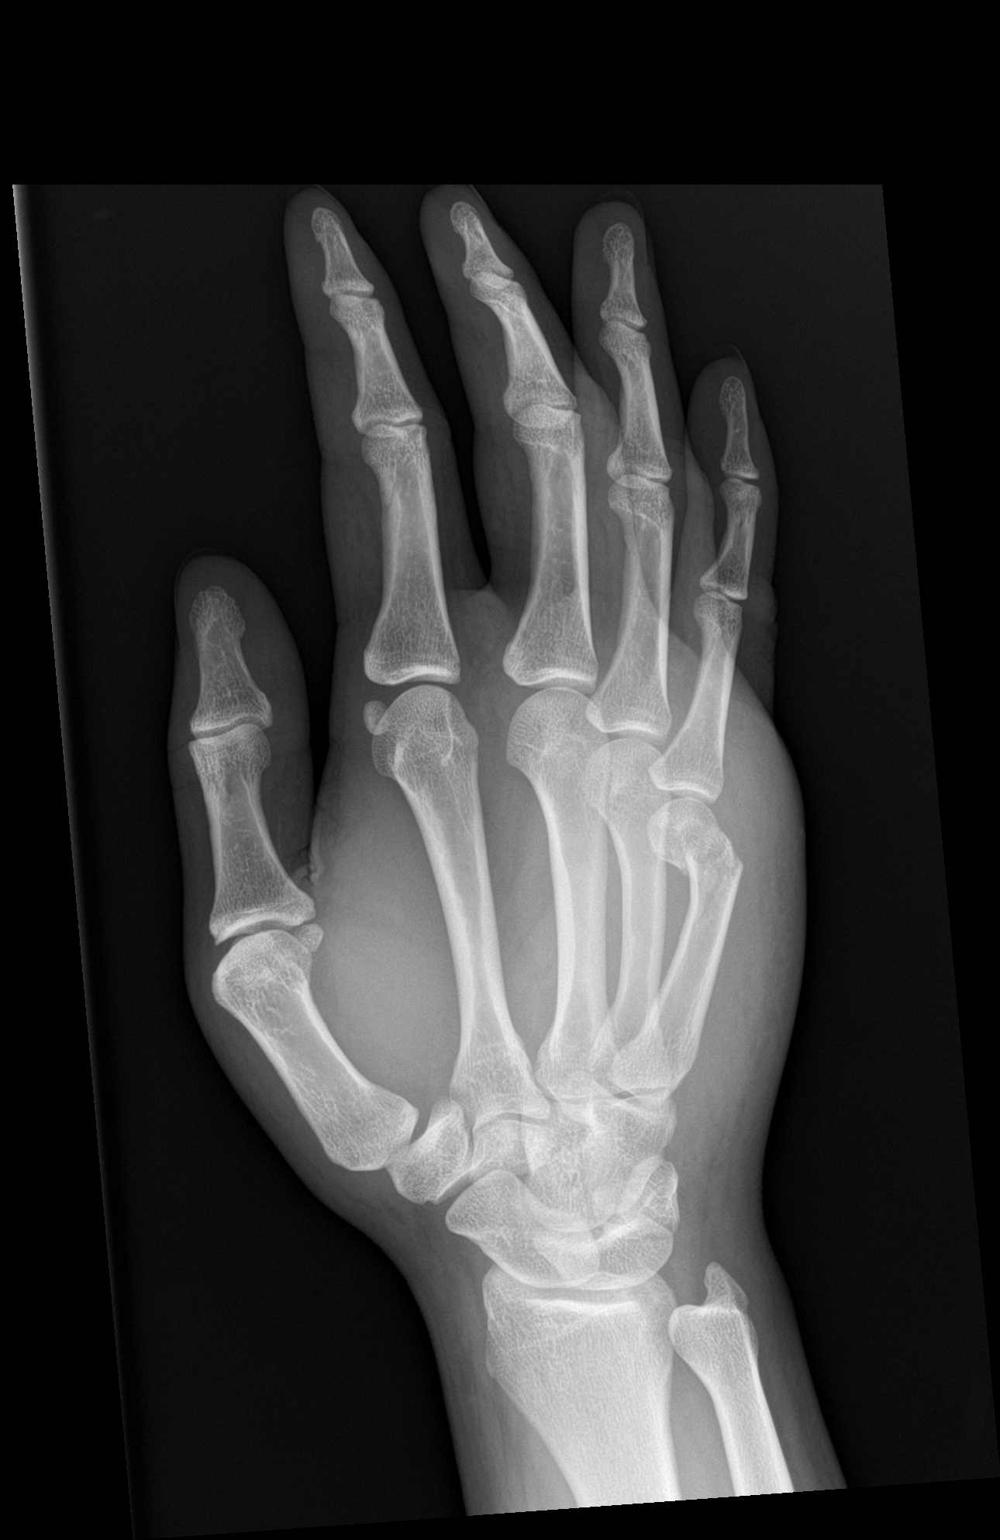

[hand lat]
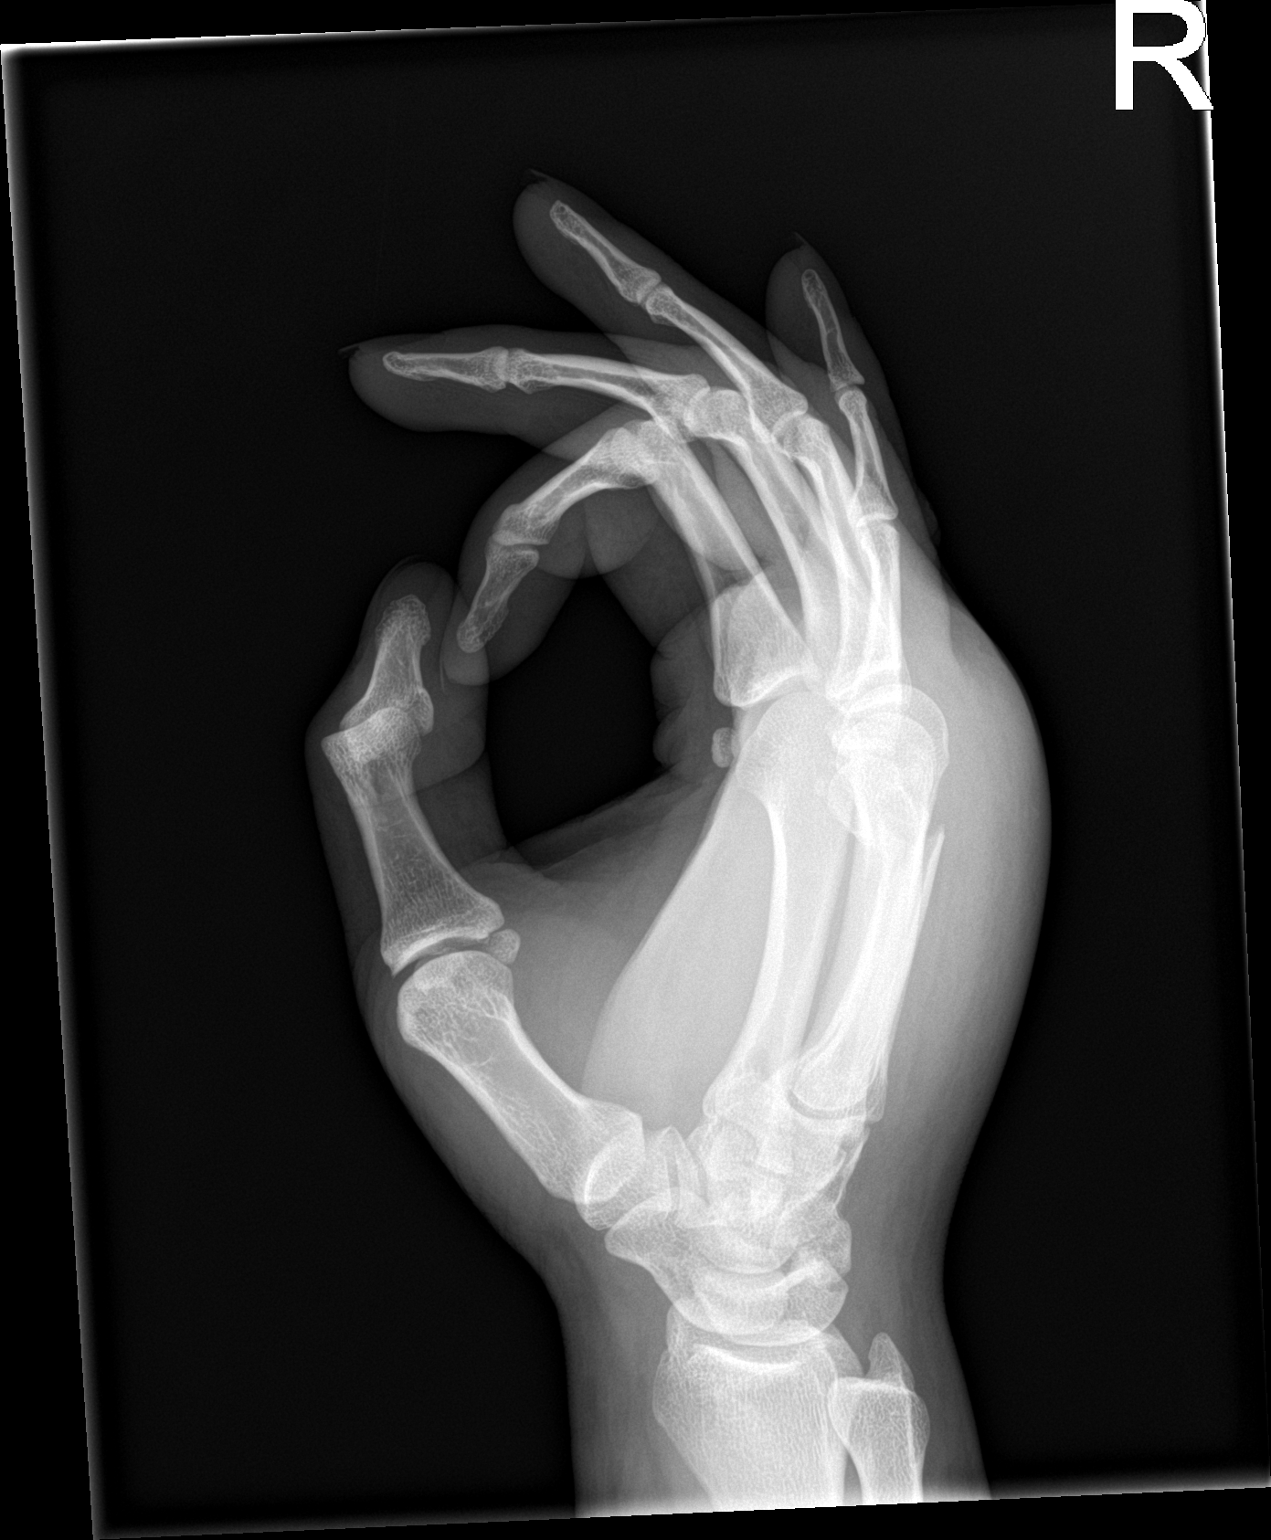

[3 of 3 positions shown; findings below may reference images not displayed]

FINDINGS: The patient has an acute fracture through the neck of the fifth
metacarpal with volar angulation and lateral rotation of the distal
fragment. Soft tissues of the hand wrist are diffusely and markedly
swollen.
IMPRESSION: Acute fracture neck of the fifth metacarpal as described with
extensive soft tissue swelling about the hand.

## 2022-09-07 ENCOUNTER — Ambulatory Visit
Admission: EM | Admit: 2022-09-07 | Discharge: 2022-09-07 | Disposition: A | Discharge status: Home / Self Care | Payer: Self-pay | Attending: Emergency Medicine | Admitting: Emergency Medicine

## 2022-09-07 DIAGNOSIS — J302 Other seasonal allergic rhinitis: Secondary | ICD-10-CM

## 2022-09-07 DIAGNOSIS — J4521 Mild intermittent asthma with (acute) exacerbation: Secondary | ICD-10-CM

## 2022-09-07 MED ORDER — ALBUTEROL SULFATE (2.5 MG/3ML) 0.083% IN NEBU
2.5000 mg | INHALATION_SOLUTION | Freq: Once | RESPIRATORY_TRACT | Status: AC
  Administered 2022-09-07: 2.5 mg via RESPIRATORY_TRACT

## 2022-09-07 MED ORDER — FLUTICASONE PROPIONATE 50 MCG/ACT NA SUSP: 1.0000 | Freq: Every day | NASAL | 1 refills | Status: AC

## 2022-09-07 MED ORDER — AEROCHAMBER PLUS FLO-VU LARGE MISC: 1.0000 | Freq: Once | 0 refills | Status: AC

## 2022-09-07 MED ORDER — PROMETHAZINE-DM 6.25-15 MG/5ML PO SYRP: 5.0000 mL | ORAL_SOLUTION | Freq: Four times a day (QID) | ORAL | 0 refills | Status: AC | PRN

## 2022-09-07 MED ORDER — CETIRIZINE HCL 10 MG PO TABS: 10.0000 mg | ORAL_TABLET | Freq: Every day | ORAL | 1 refills | Status: AC

## 2022-09-07 MED ORDER — ALBUTEROL SULFATE HFA 108 (90 BASE) MCG/ACT IN AERS: 2.0000 | INHALATION_SPRAY | Freq: Four times a day (QID) | RESPIRATORY_TRACT | 1 refills | Status: AC | PRN

## 2022-09-07 MED ORDER — DEXAMETHASONE SODIUM PHOSPHATE 10 MG/ML IJ SOLN
10.0000 mg | Freq: Once | INTRAMUSCULAR | Status: AC
  Administered 2022-09-07: 10 mg via INTRAMUSCULAR

## 2022-09-07 NOTE — ED Provider Notes (Signed)
[facility]    CSN: [id number] Arrival date & time: 09/07/22  1208    HISTORY   Chief Complaint  Patient presents with   Cough   HPI Eric King is a pleasant, 31 y.o. male who presents to urgent care today. Patient complains of severe cough, scratchy throat, rhinorrhea and nasal congestion.  Patient states he has been taking over-the-counter cold and flu preparations with no relief of symptoms.  Patient reports a history of seasonal allergies, usually worse in the spring, states not currently taking any allergy medications.  Patient states sometimes he coughs so hard to get short of breath.  Patient has normal vital signs on arrival today.  Patient denies fever, body aches, chills, nausea, vomiting, diarrhea, known sick contacts.  Patient states his cough is not productive and usually worse at night.  The history is provided by the patient.   Past Medical History:  Diagnosis Date   Thyroid disease    There are no problems to display for this patient.  History reviewed. No pertinent surgical history.  Home Medications    Prior to Admission medications   Medication Sig Start Date End Date Taking? Authorizing Provider    Family History Family History  Problem Relation Age of Onset   Heart failure Father    Social History Social History   Tobacco Use   Smoking status: Every Day    Types: Cigars   Smokeless tobacco: Never  Substance Use Topics   Alcohol use: Yes    Comment: occ   Drug use: Yes    Types: Marijuana   Allergies   Patient has no known allergies.  Review of Systems Review of Systems Pertinent findings revealed after performing a 14 point review of systems has been noted in the history of present illness.  Physical Exam Triage Vital Signs ED Triage Vitals  Enc Vitals Group     BP 08/26/21 0827 (!) 147/82     Pulse Rate 08/26/21 0827 72     Resp 08/26/21 0827 18     Temp 08/26/21 0827 98.3 F (36.8 C)     Temp Source 08/26/21  0827 Oral     SpO2 08/26/21 0827 98 %     Weight --      Height --      Head Circumference --      Peak Flow --      Pain Score 08/26/21 0826 5     Pain Loc --      Pain Edu? --      Excl. in [place]? --   No data found.  Updated Vital Signs BP (!) 137/95 (BP Location: Left Arm)   Pulse 96   Temp 98.8 F (37.1 C) (Oral)   Resp 18   SpO2 98%   Physical Exam Vitals and nursing note reviewed.  Constitutional:      General: He is not in acute distress.    Appearance: Normal appearance. He is not ill-appearing.  HENT:     Head: Normocephalic and atraumatic.     Salivary Glands: Right salivary gland is not diffusely enlarged or tender. Left salivary gland is not diffusely enlarged or tender.     Right Ear: Hearing, ear canal and external ear normal. No drainage. No middle ear effusion. There is no impacted cerumen. Tympanic membrane is bulging. Tympanic membrane is not injected or erythematous.     Left Ear: Hearing, ear canal and external ear normal. No drainage.  No middle ear effusion.  There is no impacted cerumen. Tympanic membrane is bulging. Tympanic membrane is not injected or erythematous.     Ears:     Comments: Bilateral EACs normal, both TMs bulging with clear fluid    Nose: Rhinorrhea present. No nasal deformity, septal deviation, signs of injury, nasal tenderness, mucosal edema or congestion. Rhinorrhea is clear.     Right Nostril: Occlusion present. No foreign body, epistaxis or septal hematoma.     Left Nostril: Occlusion present. No foreign body, epistaxis or septal hematoma.     Right Turbinates: Enlarged, swollen and pale.     Left Turbinates: Enlarged, swollen and pale.     Right Sinus: No maxillary sinus tenderness or frontal sinus tenderness.     Left Sinus: No maxillary sinus tenderness or frontal sinus tenderness.     Mouth/Throat:     Lips: Pink. No lesions.     Mouth: Mucous membranes are moist. No oral lesions.     Pharynx: Oropharynx is clear. Uvula midline.  No posterior oropharyngeal erythema or uvula swelling.     Tonsils: No tonsillar exudate. 0 on the right. 0 on the left.     Comments: Postnasal drip Eyes:     General: Lids are normal.        Right eye: No discharge.        Left eye: No discharge.     Extraocular Movements: Extraocular movements intact.     Conjunctiva/sclera: Conjunctivae normal.     Right eye: Right conjunctiva is not injected.     Left eye: Left conjunctiva is not injected.  Neck:     Trachea: Trachea and phonation normal.  Cardiovascular:     Rate and Rhythm: Normal rate and regular rhythm.     Pulses: Normal pulses.     Heart sounds: Normal heart sounds. No murmur heard.    No friction rub. No gallop.  Pulmonary:     Effort: Pulmonary effort is normal. No tachypnea, bradypnea, accessory muscle usage, prolonged expiration, respiratory distress or retractions.     Breath sounds: No stridor, decreased air movement or transmitted upper airway sounds. Examination of the right-upper field reveals decreased breath sounds. Examination of the left-upper field reveals decreased breath sounds. Examination of the right-middle field reveals decreased breath sounds. Examination of the left-middle field reveals decreased breath sounds. Examination of the right-lower field reveals decreased breath sounds. Examination of the left-lower field reveals decreased breath sounds. Decreased breath sounds present. No wheezing, rhonchi or rales.  Chest:     Chest wall: No tenderness.  Musculoskeletal:        General: Normal range of motion.     Cervical back: Normal range of motion and neck supple. Normal range of motion.  Lymphadenopathy:     Cervical: No cervical adenopathy.  Skin:    General: Skin is warm and dry.     Findings: No erythema or rash.  Neurological:     General: No focal deficit present.     Mental Status: He is alert and oriented to person, place, and time.  Psychiatric:        Mood and Affect: Mood normal.         Behavior: Behavior normal.     Visual Acuity Right Eye Distance:   Left Eye Distance:   Bilateral Distance:    Right Eye Near:   Left Eye Near:    Bilateral Near:     [facility] Couse / Diagnostics / Procedures:     Radiology No results found.  Procedures Procedures (including critical care time) EKG  Pending results:  Labs Reviewed - No data to display  Medications Ordered in UC: Medications  dexamethasone (DECADRON) injection 10 mg (has no administration in time range)  albuterol (PROVENTIL) (2.5 MG/3ML) 0.083% nebulizer solution 2.5 mg (2.5 mg Nebulization Given 09/07/22 1501)    UC Diagnoses / Final Clinical Impressions(s)   I have reviewed the triage vital signs and the nursing notes.  Pertinent labs & imaging results that were available during my care of the patient were reviewed by me and considered in my medical decision making (see chart for details).    Final diagnoses:  Seasonal allergic rhinitis, unspecified trigger  Mild intermittent reactive airway disease with acute exacerbation   Patient had significant improvement of work of breathing and breath sounds on auscultation post belies bronchodilator treatment.  Patient provided with a prescription for albuterol and a spacer and advised to use 4 times daily for the next several days then decrease to twice daily and as needed for shortness of breath and cough.  Patient also provided with allergy medications and advised to continue them at least throughout the winter season.  Patient provided with a prescription for Promethazine DM for nighttime cough which I do not believe he will need for very long.  Patient advised to follow-up here or with primary care if symptoms do not improve, go to the emergency room if symptoms are worsening, particularly if he is having significant shortness of breath.  ED Prescriptions     Medication Sig Dispense Auth. Provider   cetirizine (ZYRTEC ALLERGY) 10 MG tablet Take 1 tablet (10 mg  total) by mouth at bedtime. 90 tablet Lynden Oxford Scales, PA-C   fluticasone (FLONASE) 50 MCG/ACT nasal spray Place 1 spray into both nostrils daily. 47.4 mL Lynden Oxford Scales, PA-C   promethazine-dextromethorphan (PROMETHAZINE-DM) 6.25-15 MG/5ML syrup Take 5 mLs by mouth 4 (four) times daily as needed for cough. 60 mL Lynden Oxford Scales, PA-C   albuterol (VENTOLIN HFA) 108 (90 Base) MCG/ACT inhaler Inhale 2 puffs into the lungs every 6 (six) hours as needed for wheezing or shortness of breath (Cough). 36 g Lynden Oxford Scales, PA-C   Spacer/Aero-Holding Chambers (AEROCHAMBER PLUS FLO-VU LARGE) MISC 1 each by Other route once for 1 dose. 1 each Lynden Oxford Scales, PA-C      PDMP not reviewed this encounter.  Disposition Upon Discharge:  Condition: stable for discharge home Home: take medications as prescribed; routine discharge instructions as discussed; follow up as advised.  Patient presented with an acute illness with associated systemic symptoms and significant discomfort requiring urgent management. In my opinion, this is a condition that a prudent lay person (someone who possesses an average knowledge of health and medicine) may potentially expect to result in complications if not addressed urgently such as respiratory distress, impairment of bodily function or dysfunction of bodily organs.   Routine symptom specific, illness specific and/or disease specific instructions were discussed with the patient and/or caregiver at length.   As such, the patient has been evaluated and assessed, work-up was performed and treatment was provided in alignment with urgent care protocols and evidence based medicine.  Patient/parent/caregiver has been advised that the patient may require follow up for further testing and treatment if the symptoms continue in spite of treatment, as clinically indicated and appropriate.  If the patient was tested for COVID-19, Influenza and/or RSV, then  the patient/parent/guardian was advised to isolate at home pending the results of his/her diagnostic coronavirus  test and potentially longer if they're positive. I have also advised pt that if his/her COVID-19 test returns positive, it's recommended to self-isolate for at least 10 days after symptoms first appeared AND until fever-free for 24 hours without fever reducer AND other symptoms have improved or resolved. Discussed self-isolation recommendations as well as instructions for household member/close contacts as per the [facility] and [place] [facility], and also gave patient the COVID packet with this information.  Patient/parent/caregiver has been advised to return to the [facility] or PCP in 3-5 days if no better; to PCP or the Emergency Department if new signs and symptoms develop, or if the current signs or symptoms continue to change or worsen for further workup, evaluation and treatment as clinically indicated and appropriate  The patient will follow up with their current PCP if and as advised. If the patient does not currently have a PCP we will assist them in obtaining one.   The patient may need specialty follow up if the symptoms continue, in spite of conservative treatment and management, for further workup, evaluation, consultation and treatment as clinically indicated and appropriate.  Patient/parent/caregiver verbalized understanding and agreement of plan as discussed.  All questions were addressed during visit.  Please see discharge instructions below for further details of plan.  Discharge Instructions:   Discharge Instructions      Your symptoms and my physical exam findings are concerning for exacerbation of your underlying allergies.     Please see the list below for recommended medications, dosages and frequencies to provide relief of current symptoms:     Decadron IM (dexamethasone):  To quickly address your significant respiratory inflammation, you were provided with an injection of Decadron  in the office today.  You should continue to feel the full benefit of the steroid for the next 12-24 hours.    Zyrtec (cetirizine): This is an excellent second-generation antihistamine that helps to reduce respiratory inflammatory response to environmental allergens.  In some patients, this medication can cause daytime sleepiness so I recommend that you take 1 tablet daily at bedtime.     Flonase (fluticasone): This is a steroid nasal spray that you use once daily, 1 spray in each nare.  This medication does not work well if you decide to use it only used as you feel you need to, it works best used on a daily basis.  After 3 to 5 days of use, you will notice significant reduction of the inflammation and mucus production that is currently being caused by exposure to allergens, whether seasonal or environmental.  The most common side effect of this medication is nosebleeds.  If you experience a nosebleed, please discontinue use for 1 week, then feel free to resume.  I have provided you with a prescription.     ProAir, Ventolin, Proventil (albuterol): This inhaled medication contains a short acting beta agonist bronchodilator.  This medication works on the smooth muscle that opens and constricts of your airways by relaxing the muscle.  The result of relaxation of the smooth muscle is increased air movement and improved work of breathing.  This is a short acting medication that can be used every 4-6 hours as needed for increased work of breathing, shortness of breath, wheezing and excessive coughing.  I have provided you with a prescription.     Promethazine DM: Promethazine is both a nasal decongestant and an antinausea medication that makes most patients feel fairly sleepy.  The DM is dextromethorphan, a cough suppressant found in many over-the-counter cough  medications.  Please take 5 mL before bedtime to minimize your cough which will help you sleep better.  I have sent a prescription for this medication to  your pharmacy.   Please follow-up within the next 5-7 days either with your primary care provider or urgent care if your symptoms do not resolve.  If you do not have a primary care provider, we will assist you in finding one.        Thank you for visiting urgent care today.  We appreciate the opportunity to participate in your care.         This office note has been dictated using Museum/gallery curator.  Unfortunately, this method of dictation can sometimes lead to typographical or grammatical errors.  I apologize for your inconvenience in advance if this occurs.  Please do not hesitate to reach out to me if clarification is needed.      Lynden Oxford Scales, PA-C 09/07/22 1524

## 2022-09-07 NOTE — Discharge Instructions (Signed)
Your symptoms and my physical exam findings are concerning for exacerbation of your underlying allergies.     Please see the list below for recommended medications, dosages and frequencies to provide relief of current symptoms:     Decadron IM (dexamethasone):  To quickly address your significant respiratory inflammation, you were provided with an injection of Decadron in the office today.  You should continue to feel the full benefit of the steroid for the next 12-24 hours.    Zyrtec (cetirizine): This is an excellent second-generation antihistamine that helps to reduce respiratory inflammatory response to environmental allergens.  In some patients, this medication can cause daytime sleepiness so I recommend that you take 1 tablet daily at bedtime.     Flonase (fluticasone): This is a steroid nasal spray that you use once daily, 1 spray in each nare.  This medication does not work well if you decide to use it only used as you feel you need to, it works best used on a daily basis.  After 3 to 5 days of use, you will notice significant reduction of the inflammation and mucus production that is currently being caused by exposure to allergens, whether seasonal or environmental.  The most common side effect of this medication is nosebleeds.  If you experience a nosebleed, please discontinue use for 1 week, then feel free to resume.  I have provided you with a prescription.     ProAir, Ventolin, Proventil (albuterol): This inhaled medication contains a short acting beta agonist bronchodilator.  This medication works on the smooth muscle that opens and constricts of your airways by relaxing the muscle.  The result of relaxation of the smooth muscle is increased air movement and improved work of breathing.  This is a short acting medication that can be used every 4-6 hours as needed for increased work of breathing, shortness of breath, wheezing and excessive coughing.  I have provided you with a prescription.      Promethazine DM: Promethazine is both a nasal decongestant and an antinausea medication that makes most patients feel fairly sleepy.  The DM is dextromethorphan, a cough suppressant found in many over-the-counter cough medications.  Please take 5 mL before bedtime to minimize your cough which will help you sleep better.  I have sent a prescription for this medication to your pharmacy.   Please follow-up within the next 5-7 days either with your primary care provider or urgent care if your symptoms do not resolve.  If you do not have a primary care provider, we will assist you in finding one.        Thank you for visiting urgent care today.  We appreciate the opportunity to participate in your care.

## 2022-09-07 NOTE — ED Triage Notes (Signed)
Pt c/o cough, URI sx x 2 days-no relief with OTC meds-NAD-steady gait-mother with pr

## 2022-12-26 ENCOUNTER — Other Ambulatory Visit: Payer: Self-pay

## 2022-12-26 ENCOUNTER — Encounter (HOSPITAL_BASED_OUTPATIENT_CLINIC_OR_DEPARTMENT_OTHER): Payer: Self-pay | Admitting: Emergency Medicine

## 2022-12-26 ENCOUNTER — Emergency Department (HOSPITAL_BASED_OUTPATIENT_CLINIC_OR_DEPARTMENT_OTHER)
Admission: EM | Admit: 2022-12-26 | Discharge: 2022-12-26 | Disposition: A | Discharge status: Home / Self Care | Payer: Self-pay | Attending: Emergency Medicine | Admitting: Emergency Medicine

## 2022-12-26 DIAGNOSIS — J069 Acute upper respiratory infection, unspecified: Secondary | ICD-10-CM | POA: Insufficient documentation

## 2022-12-26 DIAGNOSIS — J209 Acute bronchitis, unspecified: Secondary | ICD-10-CM | POA: Insufficient documentation

## 2022-12-26 DIAGNOSIS — Z1152 Encounter for screening for COVID-19: Secondary | ICD-10-CM | POA: Insufficient documentation

## 2022-12-26 LAB — RESP PANEL BY RT-PCR (RSV, FLU A&B, COVID)  RVPGX2
Influenza A by PCR: NEGATIVE
Influenza B by PCR: NEGATIVE
Resp Syncytial Virus by PCR: NEGATIVE
SARS Coronavirus 2 by RT PCR: NEGATIVE

## 2022-12-26 MED ORDER — PROMETHAZINE-DM 6.25-15 MG/5ML PO SYRP: 5.0000 mL | ORAL_SOLUTION | Freq: Four times a day (QID) | ORAL | 0 refills | Status: DC | PRN

## 2022-12-26 MED ORDER — PROMETHAZINE-DM 6.25-15 MG/5ML PO SYRP: 5.0000 mL | ORAL_SOLUTION | Freq: Four times a day (QID) | ORAL | 0 refills | Status: AC | PRN

## 2022-12-26 NOTE — ED Triage Notes (Signed)
Pt via pov from home with cough x 4 days. Pt reports dry cough; denies fevers, runny nose, sore throat. Pt alert & oriented, nad noted.

## 2022-12-26 NOTE — Discharge Instructions (Signed)
You were evaluated today and diagnosed with upper respiratory infection with likely bronchitis.  Please take the prescribed cough medication as needed.  As discussed this may cause drowsiness I recommend taking it at night.  If you develop any life-threatening symptoms please return to the emergency department for further evaluation.

## 2022-12-26 NOTE — ED Notes (Signed)
Patient resting quietly in stretcher, respirations even, unlabored, no acute distress noted. Denies needs at this time.

## 2022-12-26 NOTE — ED Notes (Signed)
Provider evaluated pt. Pt pending discharge.

## 2022-12-26 NOTE — ED Provider Notes (Signed)
  [facility] Provider Note   CSN: [id number] Arrival date & time: 12/26/22  1617     History {Add pertinent medical, surgical, social history, OB history to HPI:1} Chief Complaint  Patient presents with   Cough    Eric King is a 32 y.o. male.  Cough x 3 days, no fever, no sore throat, bronchitis, upper respiratory infection  HPI     Home Medications Prior to Admission medications   Medication Sig Start Date End Date Taking? Authorizing Provider  promethazine-dextromethorphan (PROMETHAZINE-DM) 6.25-15 MG/5ML syrup Take 5 mLs by mouth 4 (four) times daily as needed for cough. 12/26/22  Yes Dorothyann Peng, PA-C  albuterol (VENTOLIN HFA) 108 (90 Base) MCG/ACT inhaler Inhale 2 puffs into the lungs every 6 (six) hours as needed for wheezing or shortness of breath (Cough). 09/07/22   Lynden Oxford Scales, PA-C  cetirizine (ZYRTEC ALLERGY) 10 MG tablet Take 1 tablet (10 mg total) by mouth at bedtime. 09/07/22 03/06/23  Lynden Oxford Scales, PA-C  fluticasone (FLONASE) 50 MCG/ACT nasal spray Place 1 spray into both nostrils daily. 09/07/22   Lynden Oxford Scales, PA-C  promethazine-dextromethorphan (PROMETHAZINE-DM) 6.25-15 MG/5ML syrup Take 5 mLs by mouth 4 (four) times daily as needed for cough. 09/07/22   Lynden Oxford Scales, PA-C      Allergies    Patient has no known allergies.    Review of Systems   Review of Systems  Physical Exam Updated Vital Signs BP (!) 148/91   Pulse 92   Temp 98.6 F (37 C) (Oral)   Resp 18   Ht 6' 1"$  (1.854 m)   Wt 111.6 kg   SpO2 98%   BMI 32.46 kg/m  Physical Exam  ED Results / Procedures / Treatments   Labs (all labs ordered are listed, but only abnormal results are displayed) Labs Reviewed  RESP PANEL BY RT-PCR (RSV, FLU A&B, COVID)  [id number]    EKG None  Radiology No results found.  Procedures Procedures  {Document cardiac monitor, telemetry assessment procedure when  appropriate:1}  Medications Ordered in ED Medications - No data to display  ED Course/ Medical Decision Making/ A&P   {   Click here for ABCD2, HEART and other calculatorsREFRESH Note before signing :1}                          Medical Decision Making Risk Prescription drug management.   ***  {Document critical care time when appropriate:1} {Document review of labs and clinical decision tools ie heart score, Chads2Vasc2 etc:1}  {Document your independent review of radiology images, and any outside records:1} {Document your discussion with family members, caretakers, and with consultants:1} {Document social determinants of health affecting pt's care:1} {Document your decision making why or why not admission, treatments were needed:1} Final Clinical Impression(s) / ED Diagnoses Final diagnoses:  Upper respiratory tract infection, unspecified type  Acute bronchitis, unspecified organism    Rx / DC Orders ED Discharge Orders          Ordered    promethazine-dextromethorphan (PROMETHAZINE-DM) 6.25-15 MG/5ML syrup  4 times daily PRN        12/26/22 2011

## 2023-01-05 ENCOUNTER — Institutional Professional Consult (permissible substitution): Payer: Self-pay | Admitting: Pulmonary Disease
